# Patient Record
Sex: Male | Born: 1960 | Race: White | Hispanic: Yes | Marital: Married | Smoking: Never smoker
Health system: Southern US, Community
[De-identification: ages and names within clinical notes are randomized; demographics above are authoritative.]

## PROBLEM LIST (undated history)

## (undated) DIAGNOSIS — E785 Hyperlipidemia, unspecified: Secondary | ICD-10-CM

## (undated) HISTORY — DX: Hyperlipidemia, unspecified: E78.5

---

## 2014-09-21 ENCOUNTER — Telehealth: Payer: Self-pay | Admitting: Family Medicine

## 2014-09-21 NOTE — Telephone Encounter (Signed)
Patient advised to try urgent care today.

## 2015-03-07 ENCOUNTER — Encounter (INDEPENDENT_AMBULATORY_CARE_PROVIDER_SITE_OTHER): Payer: Self-pay | Admitting: *Deleted

## 2016-06-04 ENCOUNTER — Encounter (INDEPENDENT_AMBULATORY_CARE_PROVIDER_SITE_OTHER): Payer: Self-pay | Admitting: *Deleted

## 2016-10-04 ENCOUNTER — Ambulatory Visit (HOSPITAL_COMMUNITY)
Admission: RE | Admit: 2016-10-04 | Discharge: 2016-10-04 | Disposition: A | Discharge status: Home / Self Care | Payer: 59 | Source: Ambulatory Visit | Attending: Family Medicine | Admitting: Family Medicine

## 2016-10-04 ENCOUNTER — Encounter: Payer: Self-pay | Admitting: Family Medicine

## 2016-10-04 ENCOUNTER — Ambulatory Visit (INDEPENDENT_AMBULATORY_CARE_PROVIDER_SITE_OTHER): Payer: 59 | Admitting: Family Medicine

## 2016-10-04 DIAGNOSIS — E785 Hyperlipidemia, unspecified: Secondary | ICD-10-CM | POA: Diagnosis not present

## 2016-10-04 DIAGNOSIS — R05 Cough: Secondary | ICD-10-CM | POA: Diagnosis not present

## 2016-10-04 DIAGNOSIS — Z833 Family history of diabetes mellitus: Secondary | ICD-10-CM

## 2016-10-04 DIAGNOSIS — R059 Cough, unspecified: Secondary | ICD-10-CM

## 2016-10-04 HISTORY — DX: Hyperlipidemia, unspecified: E78.5

## 2016-10-04 LAB — CBC
HEMATOCRIT: 44.9 % (ref 38.5–50.0)
HEMOGLOBIN: 15.2 g/dL (ref 13.2–17.1)
MCH: 30.5 pg (ref 27.0–33.0)
MCHC: 33.9 g/dL (ref 32.0–36.0)
MCV: 90 fL (ref 80.0–100.0)
MPV: 11.1 fL (ref 7.5–12.5)
Platelets: 243 10*3/uL (ref 140–400)
RBC: 4.99 MIL/uL (ref 4.20–5.80)
RDW: 14.4 % (ref 11.0–15.0)
WBC: 5 10*3/uL (ref 3.8–10.8)

## 2016-10-04 LAB — COMPLETE METABOLIC PANEL WITH GFR
ALBUMIN: 4.3 g/dL (ref 3.6–5.1)
ALK PHOS: 73 U/L (ref 40–115)
ALT: 19 U/L (ref 9–46)
AST: 20 U/L (ref 10–35)
BUN: 15 mg/dL (ref 7–25)
CO2: 23 mmol/L (ref 20–31)
CREATININE: 1.01 mg/dL (ref 0.70–1.33)
Calcium: 9.1 mg/dL (ref 8.6–10.3)
Chloride: 104 mmol/L (ref 98–110)
GFR, Est Non African American: 83 mL/min (ref >=60)
Glucose, Bld: 88 mg/dL (ref 65–99)
POTASSIUM: 4.2 mmol/L (ref 3.5–5.3)
Sodium: 137 mmol/L (ref 135–146)
TOTAL PROTEIN: 7.2 g/dL (ref 6.1–8.1)
Total Bilirubin: 0.4 mg/dL (ref 0.2–1.2)

## 2016-10-04 LAB — LIPID PANEL
CHOLESTEROL: 203 mg/dL — AB (ref <200)
HDL: 41 mg/dL (ref >40)
LDL Cholesterol: 131 mg/dL — ABNORMAL HIGH (ref <100)
TRIGLYCERIDES: 154 mg/dL — AB (ref <150)
Total CHOL/HDL Ratio: 5 Ratio — ABNORMAL HIGH (ref <5.0)
VLDL: 31 mg/dL — AB (ref <30)

## 2016-10-04 NOTE — Patient Instructions (Signed)
Labs now Chest x ray today  Need pulmonary function test - ordered  See me after testing

## 2016-10-04 NOTE — Progress Notes (Signed)
Chief Complaint  Patient presents with  . Cough    congestion ~ few years   Born in [place], been in the [place] for over 30 years.  Is a [place] citizen.  Works as an Personnel officer, trained on the job.  Went to 3rd grade in [place], and does not read well.  Has a wife and children who do, and is accompanied by his son Ricky Fischer.  He speaks fair to good english and seems to understand me as I speak.  A translator is offered and encouraged, but declined. Had a well exam in Jan with labs.  Results requested. Everything normal.  Colonoscopy ordered, then cancelled by patient to go to [place] to visit mother. The complaint today is chronic cough.  Present for 20 years, intermittent at first, now more often and severe.  Feels like there is sputum deep in his chest to cough up.  It is scant, clear to white.  No shortness of breath or wheeze.  Coughs in the morning and if exerts himself.  NO history of TB or pneumonia.  Possible exposure to asbestos, no smoke or second hand smoke, no chemicals or farm products/dusts. Discussed chronic cough:  GERD or LPRD, cough asthma, allergy/PND, chemical exposure, lung disease, infections.   Patient Active Problem List   Diagnosis Date Noted  . Cough in adult patient 10/04/2016  . Family history of diabetes mellitus 10/04/2016  . Hyperlipidemia 10/04/2016    No outpatient encounter prescriptions on file as of 10/04/2016.   No facility-administered encounter medications on file as of 10/04/2016.     Past Medical History:  Diagnosis Date  . Hyperlipidemia 10/04/2016    No past surgical history on file.  Social History   Social History  . Marital status: Married    Spouse name: Ricky Fischer  . Number of children: 4  . Years of education: 3   Occupational History  . electrician     [employer]   Social History Main Topics  . Smoking status: Never Smoker  . Smokeless tobacco: Never Used  . Alcohol use Yes     Comment: 10 beers a week  . Drug use: No    . Sexual activity: Yes    Birth control/ protection: Post-menopausal   Other Topics Concern  . Not on file   Social History Narrative   Lives with wife Ricky Fischer and older son       Family History  Problem Relation Age of Onset  . Diabetes Mother   . Diabetes Father   . Diabetes Brother   . Alcohol abuse Brother   . Alcohol abuse Brother   . Diabetes Brother     Review of Systems  Constitutional: Negative for chills, fever and weight loss.  HENT: Negative for congestion and hearing loss.        No PND or allergy Sx  Eyes: Negative for blurred vision and pain.  Respiratory: Positive for cough and sputum production. Negative for hemoptysis, shortness of breath and wheezing.   Cardiovascular: Negative for chest pain, palpitations, orthopnea and leg swelling.  Gastrointestinal: Negative for abdominal pain, constipation, diarrhea and heartburn.       No heartburn or reflux  Genitourinary: Negative for dysuria and frequency.       No LUTS  Musculoskeletal: Negative for falls, joint pain and myalgias.  Neurological: Negative for dizziness, seizures and headaches.  Psychiatric/Behavioral: Negative for depression. The patient is not nervous/anxious and does not have insomnia.     BP  114/76 (BP Location: Right Arm, Patient Position: Sitting, Cuff Size: Normal)   Pulse 64   Temp 98.8 F (37.1 C) (Temporal)   Resp 16   Ht 5\' 3"  (1.6 m)   Wt 200 lb 6.4 oz (90.9 kg)   SpO2 97%   BMI 35.50 kg/m   Physical Exam  Constitutional: He is oriented to person, place, and time. He appears well-developed and well-nourished.  Muscular, not appreciably overweight  HENT:  Head: Normocephalic and atraumatic.  Right Ear: External ear normal.  Left Ear: External ear normal.  Mouth/Throat: Oropharynx is clear and moist.  Eyes: Conjunctivae are normal. Pupils are equal, round, and reactive to light.  Neck: Normal range of motion. Neck supple. No thyromegaly present.  Cardiovascular: Normal  rate, regular rhythm and normal heart sounds.   Pulmonary/Chest: Effort normal. No respiratory distress. He has no wheezes. He has no rales. He exhibits no tenderness.  Few rhonchi R base  Abdominal: Soft. Bowel sounds are normal.  No HSM  Musculoskeletal: Normal range of motion. He exhibits no edema.  No clubbing, cyanosis, or edema  Lymphadenopathy:    He has no cervical adenopathy.  Neurological: He is alert and oriented to person, place, and time.  Gait normal  Skin: Skin is warm and dry.  Psychiatric: He has a normal mood and affect. His behavior is normal. Thought content normal.  Nursing note and vitals reviewed.   1. Cough in adult patient Discussed above - DG Chest 2 View; Future - Pulmonary function test; Future  2. Family history of diabetes mellitus  - CBC - COMPLETE METABOLIC PANEL WITH GFR - Lipid panel - Urinalysis, Routine w reflex microscopic  3. Hyperlipidemia, unspecified hyperlipidemia type  Greater than 50% of this visit was spent in counseling and coordinating care.  Total face to face time: 30 min.  As above, discussed cough/DDx/workup/return    Patient Instructions  Labs now Chest x ray today  Need pulmonary function test - ordered  See me after testing    Ricky MooreYvonne Sue Milledge Gerding, MD

## 2016-10-05 ENCOUNTER — Encounter: Payer: Self-pay | Admitting: Family Medicine

## 2016-10-05 LAB — URINALYSIS, ROUTINE W REFLEX MICROSCOPIC
Bilirubin Urine: NEGATIVE
GLUCOSE, UA: NEGATIVE
Hgb urine dipstick: NEGATIVE
Ketones, ur: NEGATIVE
LEUKOCYTES UA: NEGATIVE
NITRITE: NEGATIVE
PH: 5.5 (ref 5.0–8.0)
Protein, ur: NEGATIVE
Specific Gravity, Urine: 1.016 (ref 1.001–1.035)

## 2018-03-25 IMAGING — DX DG CHEST 2V
2 series · 2 of 2 positions shown · non-contrast
Comparison: None.

CLINICAL DATA: Productive cough for a long time. Worse when active.

EXAM:
CHEST  2 VIEW

[chest pa]
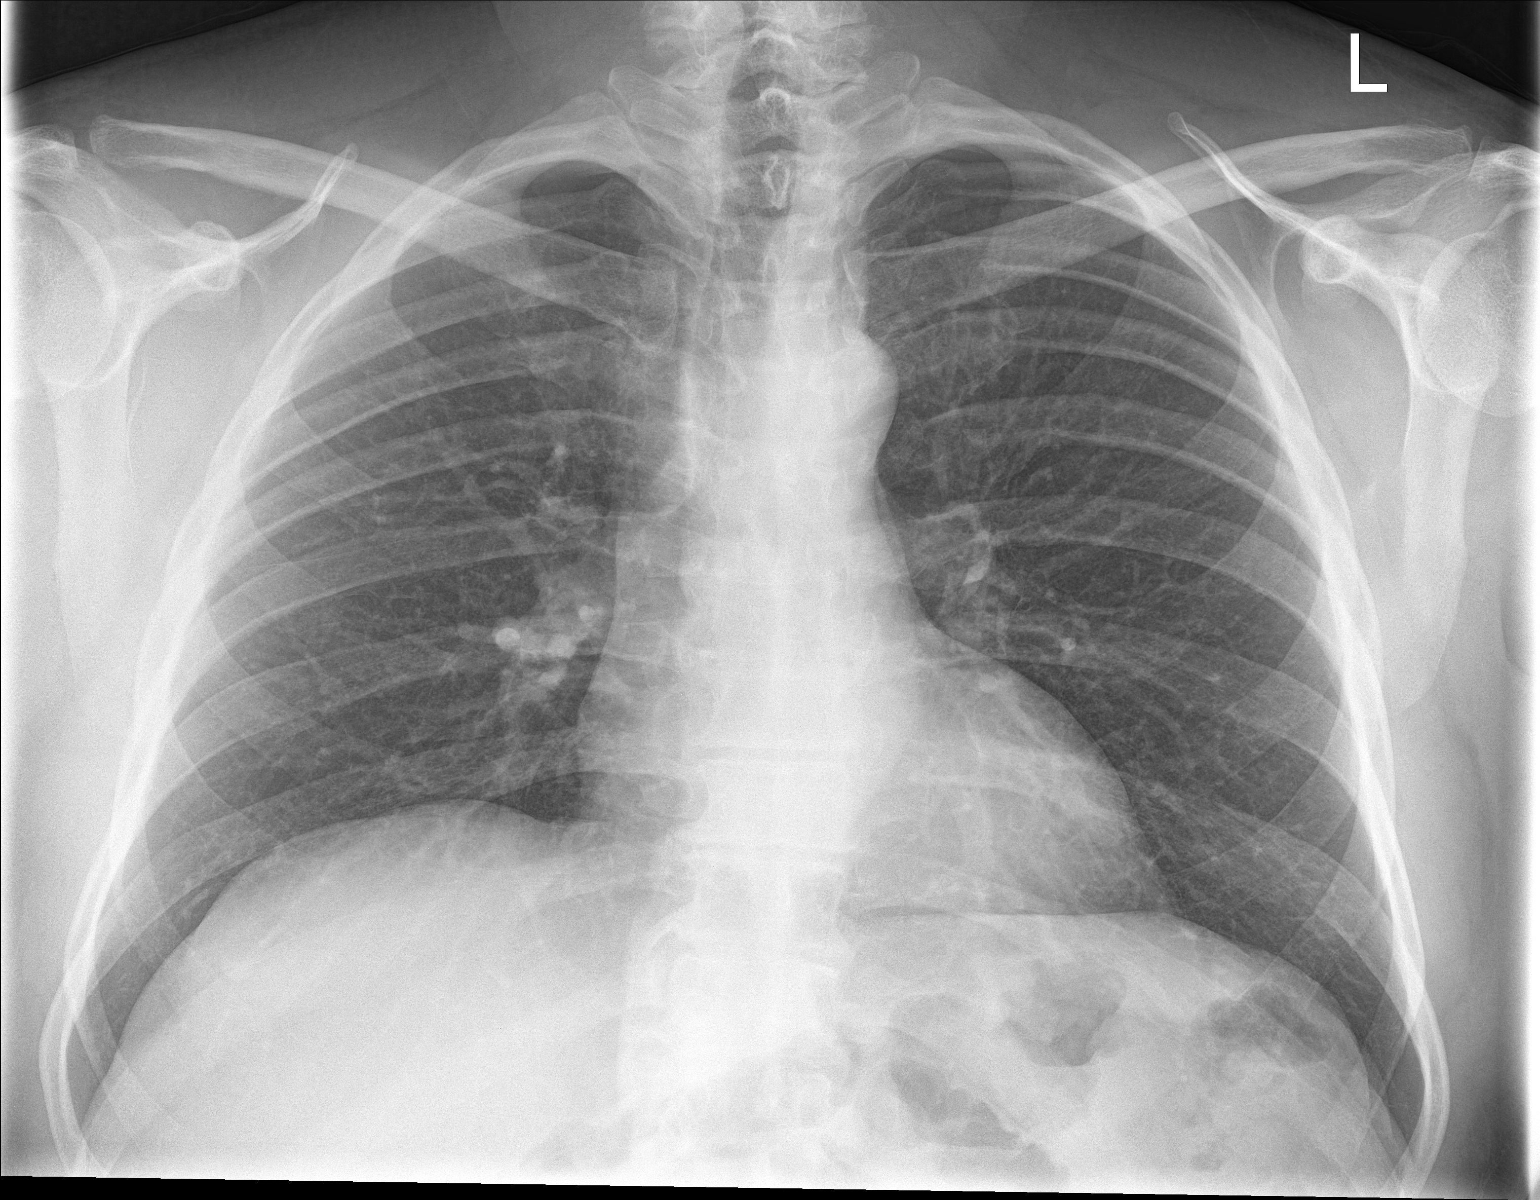

[chest lat]
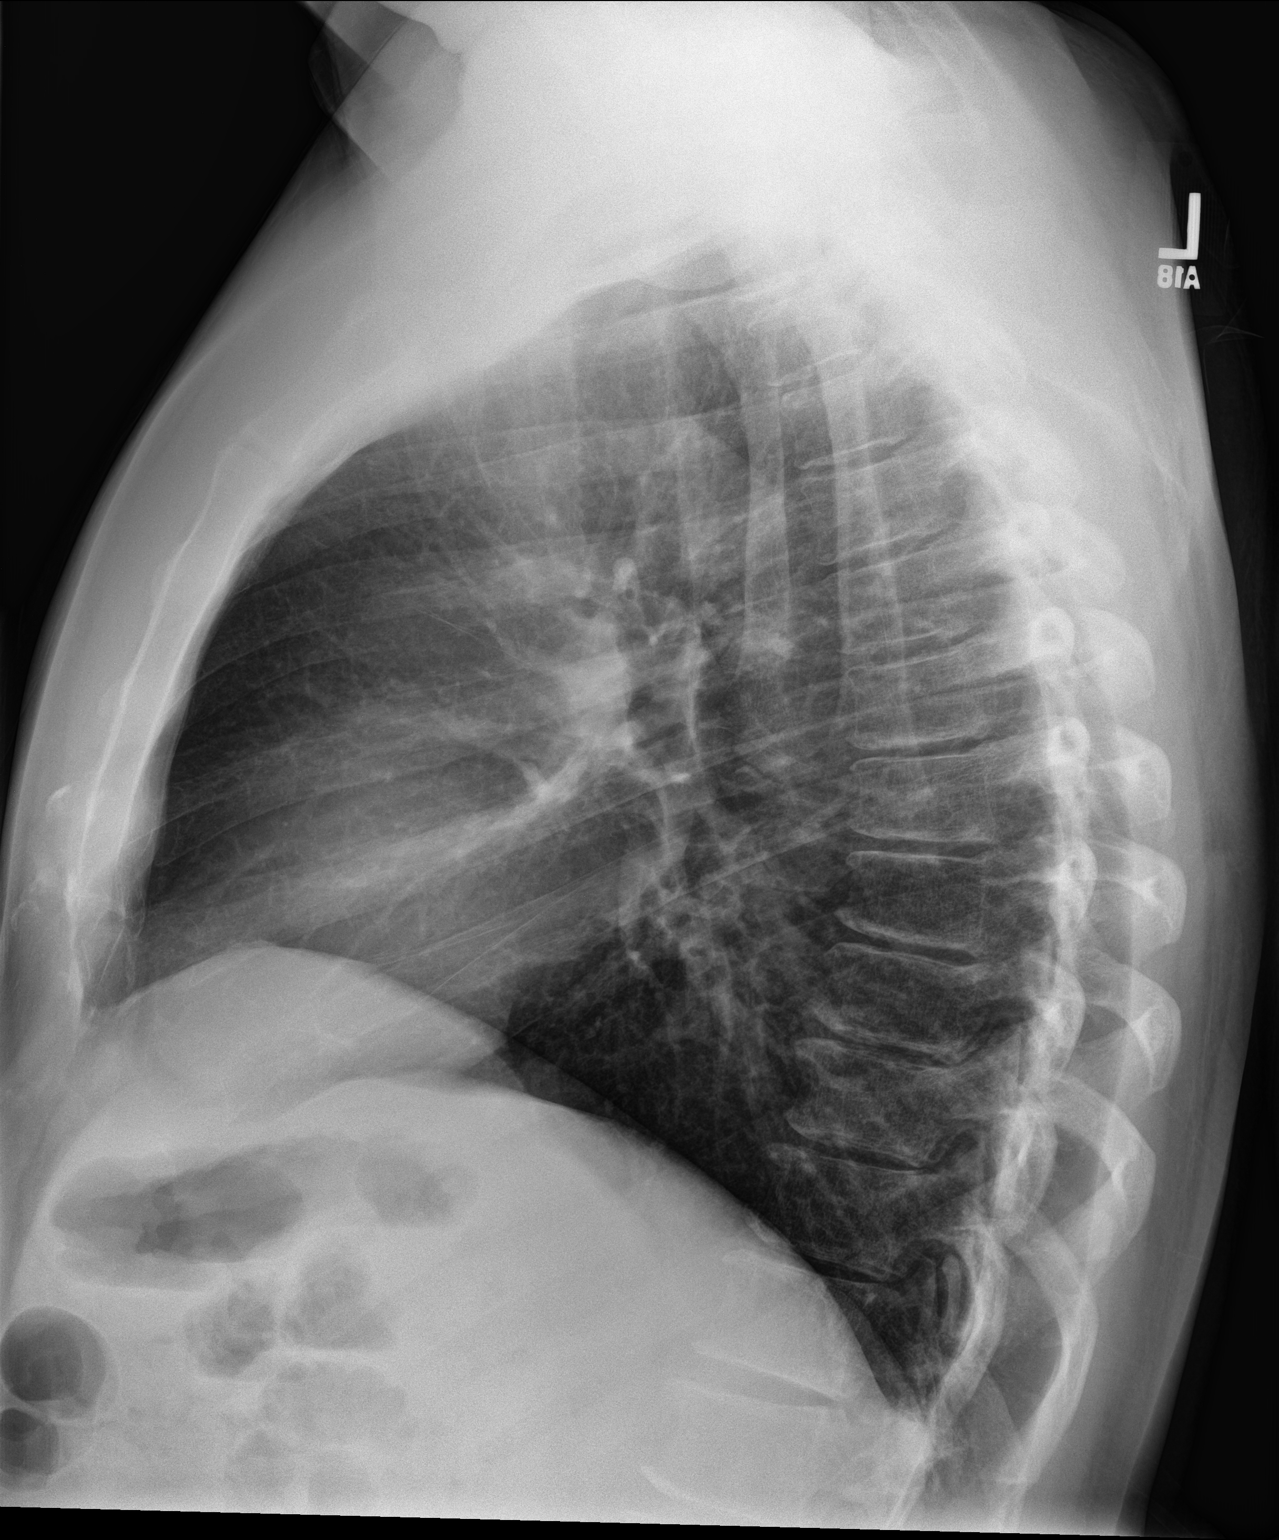

[2 of 2 positions shown; findings below may reference images not displayed]

FINDINGS: Normal sized heart. Clear lungs. Minimal peribronchial thickening.
Thoracic spine degenerative changes.
IMPRESSION: Minimal bronchitic changes.

## 2022-01-01 DIAGNOSIS — S058X2A Other injuries of left eye and orbit, initial encounter: Secondary | ICD-10-CM | POA: Diagnosis not present

## 2022-01-01 DIAGNOSIS — S058X1A Other injuries of right eye and orbit, initial encounter: Secondary | ICD-10-CM | POA: Diagnosis not present

## 2022-02-24 DIAGNOSIS — E291 Testicular hypofunction: Secondary | ICD-10-CM | POA: Diagnosis not present

## 2022-02-24 DIAGNOSIS — Z0000 Encounter for general adult medical examination without abnormal findings: Secondary | ICD-10-CM | POA: Diagnosis not present

## 2022-02-24 DIAGNOSIS — R5382 Chronic fatigue, unspecified: Secondary | ICD-10-CM | POA: Diagnosis not present

## 2022-02-24 DIAGNOSIS — E559 Vitamin D deficiency, unspecified: Secondary | ICD-10-CM | POA: Diagnosis not present

## 2022-03-03 DIAGNOSIS — R5383 Other fatigue: Secondary | ICD-10-CM | POA: Diagnosis not present

## 2023-04-16 ENCOUNTER — Telehealth: Payer: Self-pay | Admitting: General Practice

## 2023-04-16 NOTE — Telephone Encounter (Signed)
Can Dr Dettinger advise on this please.   Copied from CRM [id number]. Topic: Appointments - Scheduling Inquiry for Clinic >> Apr 16, 2023  2:43 PM Ricky Fischer wrote: Reason for CRM: Ricky Fischer called in. Ricky Fischer would be a new patient and requesting to have Dr. Louanne Fischer as his PCP. I advised that the Dr is not taking in any new patients at the moment, but they expressed that the Dr already has Ricky Fischer (spouse) as a patient and also requesting the Dr Dettinger because he speaks spanish. Best cb# [phone]

## 2023-04-17 NOTE — Telephone Encounter (Signed)
LMTCB to schedule appt

## 2023-04-17 NOTE — Telephone Encounter (Signed)
Yes I will take him on as a patient, please let him know that I may be scheduled out a little bit but we can go ahead and schedule him.

## 2023-06-05 ENCOUNTER — Ambulatory Visit: Payer: BC Managed Care – PPO | Admitting: Family Medicine

## 2023-06-05 ENCOUNTER — Encounter: Payer: Self-pay | Admitting: Family Medicine

## 2023-06-05 VITALS — BP 127/78 | HR 93 | Ht 63.0 in | Wt 187.0 lb

## 2023-06-05 DIAGNOSIS — E785 Hyperlipidemia, unspecified: Secondary | ICD-10-CM | POA: Diagnosis not present

## 2023-06-05 DIAGNOSIS — K219 Gastro-esophageal reflux disease without esophagitis: Secondary | ICD-10-CM | POA: Diagnosis not present

## 2023-06-05 DIAGNOSIS — H9193 Unspecified hearing loss, bilateral: Secondary | ICD-10-CM

## 2023-06-05 DIAGNOSIS — Z125 Encounter for screening for malignant neoplasm of prostate: Secondary | ICD-10-CM | POA: Diagnosis not present

## 2023-06-05 DIAGNOSIS — N529 Male erectile dysfunction, unspecified: Secondary | ICD-10-CM

## 2023-06-05 LAB — LIPID PANEL

## 2023-06-05 MED ORDER — SILDENAFIL CITRATE 100 MG PO TABS: 50.0000 mg | ORAL_TABLET | Freq: Every day | ORAL | 1 refills | Status: DC | PRN

## 2023-06-05 MED ORDER — FAMOTIDINE 20 MG PO TABS: 20.0000 mg | ORAL_TABLET | Freq: Two times a day (BID) | ORAL | 3 refills | Status: DC

## 2023-06-05 NOTE — Progress Notes (Signed)
BP 127/78   Pulse 93   Ht 5\' 3"  (1.6 m)   Wt 187 lb (84.8 kg)   SpO2 99%   BMI 33.13 kg/m    Subjective:    Patient ID: Ricky Fischer, male    DOB: [birth year], 63 y.o.   MRN: [id number]  HPI: Paublo Fischer is a 63 y.o. male presenting on 06/05/2023 for Establish Care (Request labs) and Hearing Problem (Loss of hearing)   HPI Indigestion Patient is having indigestion and heartburn and acid and says he does not get it every day but gets it sometimes.  He says he was diagnosed with an ulcer in the past when he had a scope 3 years ago but has not had too many issues with it since.  He does not have anything he has been taking.  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  He wants to get it examined and checked out.  Hyperlipidemia Patient is coming in for recheck of his hyperlipidemia. The patient is currently taking no medicine, diet control. They deny any issues with myalgias or history of liver damage from it. They deny any focal numbness or weakness or chest pain.   Relevant past medical, surgical, family and social history reviewed and updated as indicated. Interim medical history since our last visit reviewed. Allergies and medications reviewed and updated.  Review of Systems  Constitutional:  Negative for chills and fever.  HENT:  Positive for hearing loss. Negative for congestion, ear pain, facial swelling and sore throat.   Eyes:  Negative for visual disturbance.  Respiratory:  Negative for shortness of breath and wheezing.   Cardiovascular:  Negative for chest pain and leg swelling.  Musculoskeletal:  Negative for back pain and gait problem.  Skin:  Negative for rash.  Neurological:  Negative for dizziness and light-headedness.  All other systems reviewed and are negative.   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, [employer]  Tobacco Use   Smoking status: Never   Smokeless tobacco: Never  Vaping Use   Vaping status: Never Used  Substance and Sexual Activity   Alcohol use: Yes    Alcohol/week: 12.0 standard drinks of alcohol    Types: 12 Cans of beer per week   Drug use: No   Sexual activity: Yes    Birth control/protection: Post-menopausal  Other Topics Concern   Not on file  Social History Narrative   Lives with wife Ricky Fischer and older son   Social Drivers of Corporate investment banker Strain: Not on file  Food Insecurity: Not on file  Transportation Needs: Not on file  Physical Activity: Not on file  Stress: Not on file  Social Connections: Not on file  Intimate Partner Violence: Not on file    History reviewed. No pertinent surgical history.  Family History  Problem Relation Age of Onset   Diabetes Mother    Diabetes Father    Diabetes Sister    Diabetes Sister    Diabetes Brother    Alcohol abuse Brother  Diabetes Brother    Alcohol abuse Brother    Alcohol abuse Brother    Diabetes Brother     Allergies as of 06/05/2023   No Known Allergies      Medication List        Accurate as of June 05, 2023 11:23 AM. If you have any questions, ask your nurse or doctor.          famotidine 20 MG tablet Commonly known as: PEPCID Take 1 tablet (20 mg total) by mouth 2 (two) times daily. Started by: Elige Radon Lylith Bebeau   sildenafil 100 MG tablet Commonly known as: Viagra Take 0.5-1 tablets (50-100 mg total) by mouth daily as needed for erectile dysfunction. Started by: Elige Radon Dorothy Polhemus           Objective:    BP 127/78   Pulse 93   Ht 5\' 3"  (1.6 m)   Wt 187 lb (84.8 kg)   SpO2 99%   BMI  33.13 kg/m   Wt Readings from Last 3 Encounters:  06/05/23 187 lb (84.8 kg)  10/04/16 200 lb 6.4 oz (90.9 kg)    Physical Exam Vitals and nursing note reviewed.  Constitutional:      General: He is not in acute distress.    Appearance: He is well-developed. He is not diaphoretic.  HENT:     Right Ear: Tympanic membrane and ear canal normal.     Left Ear: Tympanic membrane and ear canal normal.     Mouth/Throat:     Mouth: Mucous membranes are moist.     Pharynx: Oropharynx is clear. No oropharyngeal exudate or posterior oropharyngeal erythema.  Eyes:     General: No scleral icterus.    Conjunctiva/sclera: Conjunctivae normal.  Neck:     Thyroid: No thyromegaly.  Cardiovascular:     Rate and Rhythm: Normal rate and regular rhythm.     Heart sounds: Normal heart sounds. No murmur heard. Pulmonary:     Effort: Pulmonary effort is normal. No respiratory distress.     Breath sounds: Normal breath sounds. No wheezing.  Musculoskeletal:        General: No swelling. Normal range of motion.     Cervical back: Neck supple.  Lymphadenopathy:     Cervical: No cervical adenopathy.  Skin:    General: Skin is warm and dry.     Findings: No rash.  Neurological:     Mental Status: He is alert and oriented to person, place, and time.     Coordination: Coordination normal.  Psychiatric:        Behavior: Behavior normal.         Assessment & Plan:   Problem List Items Addressed This Visit       Other   Hyperlipidemia - Primary   Relevant Medications   sildenafil (VIAGRA) 100 MG tablet   Other Relevant Orders   CBC with Differential/Platelet   CMP14+EGFR   Lipid panel   Other Visit Diagnoses       Gastroesophageal reflux disease without esophagitis       Relevant Medications   famotidine (PEPCID) 20 MG tablet   Other Relevant Orders   CBC with Differential/Platelet   CMP14+EGFR     Erectile dysfunction, unspecified erectile dysfunction type       Relevant Medications    sildenafil (VIAGRA) 100 MG tablet     Prostate cancer screening       Relevant Orders   PSA, total and free  Bilateral hearing loss, unspecified hearing loss type       Relevant Orders   Ambulatory referral to Audiology       Will do referral to audiology for his hearing.  Will check blood work today.  Refilled his sildenafil that he had previously that he uses as needed.  Gave him famotidine that he can use as needed for reflux or acid or heartburn or indigestion. Follow up plan: Return in about 1 year (around 06/04/2024), or if symptoms worsen or fail to improve, for Physical exam.  Arville Care, MD [facility] 06/05/2023, 11:23 AM

## 2023-06-06 LAB — CBC WITH DIFFERENTIAL/PLATELET
Basophils Absolute: 0 10*3/uL (ref 0.0–0.2)
Basos: 1 %
EOS (ABSOLUTE): 0 10*3/uL (ref 0.0–0.4)
Eos: 1 %
Hematocrit: 46.8 % (ref 37.5–51.0)
Hemoglobin: 15.7 g/dL (ref 13.0–17.7)
Immature Grans (Abs): 0 10*3/uL (ref 0.0–0.1)
Immature Granulocytes: 0 %
Lymphocytes Absolute: 0.6 10*3/uL — ABNORMAL LOW (ref 0.7–3.1)
Lymphs: 11 %
MCH: 30.9 pg (ref 26.6–33.0)
MCHC: 33.5 g/dL (ref 31.5–35.7)
MCV: 92 fL (ref 79–97)
Monocytes Absolute: 0.3 10*3/uL (ref 0.1–0.9)
Monocytes: 6 %
Neutrophils Absolute: 4.8 10*3/uL (ref 1.4–7.0)
Neutrophils: 81 %
Platelets: 236 10*3/uL (ref 150–450)
RBC: 5.08 x10E6/uL (ref 4.14–5.80)
RDW: 13.4 % (ref 11.6–15.4)
WBC: 5.8 10*3/uL (ref 3.4–10.8)

## 2023-06-06 LAB — CMP14+EGFR
ALT: 17 IU/L (ref 0–44)
AST: 22 IU/L (ref 0–40)
Albumin: 4.4 g/dL (ref 3.9–4.9)
Alkaline Phosphatase: 77 IU/L (ref 44–121)
BUN/Creatinine Ratio: 17 (ref 10–24)
BUN: 15 mg/dL (ref 8–27)
Bilirubin Total: 0.6 mg/dL (ref 0.0–1.2)
CO2: 24 mmol/L (ref 20–29)
Calcium: 9 mg/dL (ref 8.6–10.2)
Chloride: 100 mmol/L (ref 96–106)
Creatinine, Ser: 0.87 mg/dL (ref 0.76–1.27)
Globulin, Total: 2.9 g/dL (ref 1.5–4.5)
Glucose: 82 mg/dL (ref 70–99)
Potassium: 3.9 mmol/L (ref 3.5–5.2)
Sodium: 139 mmol/L (ref 134–144)
Total Protein: 7.3 g/dL (ref 6.0–8.5)
eGFR: 97 mL/min/{1.73_m2} (ref >59)

## 2023-06-06 LAB — LIPID PANEL
Cholesterol, Total: 203 mg/dL — ABNORMAL HIGH (ref 100–199)
HDL: 50 mg/dL (ref >39)
LDL CALC COMMENT:: 4.1 ratio (ref 0.0–5.0)
LDL Chol Calc (NIH): 134 mg/dL — ABNORMAL HIGH (ref 0–99)
Triglycerides: 105 mg/dL (ref 0–149)
VLDL Cholesterol Cal: 19 mg/dL (ref 5–40)

## 2023-06-06 LAB — PSA, TOTAL AND FREE
PSA, Free Pct: 20 %
PSA, Free: 0.24 ng/mL
Prostate Specific Ag, Serum: 1.2 ng/mL (ref 0.0–4.0)

## 2023-06-07 ENCOUNTER — Other Ambulatory Visit: Payer: Self-pay

## 2023-06-07 MED ORDER — ROSUVASTATIN CALCIUM 5 MG PO TABS: 5.0000 mg | ORAL_TABLET | Freq: Every day | ORAL | 1 refills | Status: DC

## 2023-06-18 DIAGNOSIS — Z20822 Contact with and (suspected) exposure to covid-19: Secondary | ICD-10-CM | POA: Diagnosis not present

## 2023-06-18 DIAGNOSIS — J209 Acute bronchitis, unspecified: Secondary | ICD-10-CM | POA: Diagnosis not present

## 2023-06-18 DIAGNOSIS — B338 Other specified viral diseases: Secondary | ICD-10-CM | POA: Diagnosis not present

## 2023-09-16 ENCOUNTER — Other Ambulatory Visit: Payer: Self-pay | Admitting: Family Medicine

## 2023-09-16 DIAGNOSIS — K219 Gastro-esophageal reflux disease without esophagitis: Secondary | ICD-10-CM

## 2023-09-16 MED ORDER — FAMOTIDINE 20 MG PO TABS: 20.0000 mg | ORAL_TABLET | Freq: Two times a day (BID) | ORAL | 3 refills | Status: AC

## 2023-09-16 MED ORDER — ROSUVASTATIN CALCIUM 5 MG PO TABS
5.0000 mg | ORAL_TABLET | Freq: Every day | ORAL | 3 refills | Status: DC
Start: 2023-09-16 — End: 2023-12-20

## 2023-09-16 NOTE — Progress Notes (Signed)
 Sent refills for him

## 2023-10-16 ENCOUNTER — Other Ambulatory Visit: Payer: Self-pay | Admitting: Family Medicine

## 2023-10-16 DIAGNOSIS — N529 Male erectile dysfunction, unspecified: Secondary | ICD-10-CM

## 2023-12-20 ENCOUNTER — Other Ambulatory Visit: Payer: Self-pay | Admitting: Family Medicine

## 2023-12-20 ENCOUNTER — Telehealth: Payer: Self-pay

## 2023-12-20 ENCOUNTER — Other Ambulatory Visit: Payer: Self-pay

## 2023-12-20 DIAGNOSIS — N529 Male erectile dysfunction, unspecified: Secondary | ICD-10-CM

## 2023-12-20 MED ORDER — SILDENAFIL CITRATE 100 MG PO TABS: 50.0000 mg | ORAL_TABLET | ORAL | 1 refills | Status: DC | PRN

## 2023-12-20 MED ORDER — ROSUVASTATIN CALCIUM 5 MG PO TABS: 5.0000 mg | ORAL_TABLET | Freq: Every day | ORAL | 3 refills | Status: AC

## 2023-12-20 MED ORDER — SILDENAFIL CITRATE 100 MG PO TABS: 50.0000 mg | ORAL_TABLET | ORAL | 1 refills | Status: AC | PRN

## 2023-12-20 NOTE — Telephone Encounter (Signed)
 During wife's appt today pt asked for refill of his sidenafil. Ok per Dettinger to send refills.  Rx sent to [facility] in [place] originally but [facility] [place].  Resent Rx [facility] [place].

## 2023-12-20 NOTE — Progress Notes (Signed)
 Never got it, likely wrong pharmacy

## 2024-06-05 ENCOUNTER — Encounter: Payer: BC Managed Care – PPO | Admitting: Family Medicine

## 2024-07-08 ENCOUNTER — Encounter: Payer: Self-pay | Admitting: Family Medicine
